# Patient Record
Sex: Male | Born: 1995 | Race: White | Hispanic: No | Marital: Single | Smoking: Never smoker
Health system: Southern US, Community
[De-identification: ages and names within clinical notes are randomized; demographics above are authoritative.]

## PROBLEM LIST (undated history)

## (undated) DIAGNOSIS — L700 Acne vulgaris: Secondary | ICD-10-CM

## (undated) DIAGNOSIS — J301 Allergic rhinitis due to pollen: Secondary | ICD-10-CM

## (undated) HISTORY — DX: Acne vulgaris: L70.0

## (undated) HISTORY — DX: Allergic rhinitis due to pollen: J30.1

## (undated) HISTORY — PX: NASAL SEPTUM SURGERY: SHX37

---
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
## 2014-02-24 ENCOUNTER — Ambulatory Visit: Payer: BC Managed Care – PPO | Admitting: Family Medicine

## 2014-02-24 ENCOUNTER — Encounter: Payer: Self-pay | Admitting: Internal Medicine

## 2014-02-24 ENCOUNTER — Ambulatory Visit (INDEPENDENT_AMBULATORY_CARE_PROVIDER_SITE_OTHER): Payer: BC Managed Care – PPO | Admitting: Internal Medicine

## 2014-02-24 VITALS — BP 114/58 | HR 86 | Temp 98.6°F | Wt 153.0 lb

## 2014-02-24 DIAGNOSIS — J209 Acute bronchitis, unspecified: Secondary | ICD-10-CM

## 2014-02-24 DIAGNOSIS — J208 Acute bronchitis due to other specified organisms: Secondary | ICD-10-CM

## 2014-02-24 MED ORDER — AZITHROMYCIN 250 MG PO TABS: ORAL_TABLET | ORAL | Status: DC

## 2014-02-24 NOTE — Progress Notes (Signed)
Pre visit review using our clinic review tool, if applicable. No additional management support is needed unless otherwise documented below in the visit note. 

## 2014-02-24 NOTE — Patient Instructions (Addendum)
Cough, Adult  A cough is a reflex that helps clear your throat and airways. It can help heal the body or may be a reaction to an irritated airway. A cough may only last 2 or 3 weeks (acute) or may last more than 8 weeks (chronic).  CAUSES Acute cough:  Viral or bacterial infections. Chronic cough:  Infections.  Allergies.  Asthma.  Post-nasal drip.  Smoking.  Heartburn or acid reflux.  Some medicines.  Chronic lung problems (COPD).  Cancer. SYMPTOMS   Cough.  Fever.  Chest pain.  Increased breathing rate.  High-pitched whistling sound when breathing (wheezing).  Colored mucus that you cough up (sputum). TREATMENT   A bacterial cough may be treated with antibiotic medicine.  A viral cough must run its course and will not respond to antibiotics.  Your caregiver may recommend other treatments if you have a chronic cough. HOME CARE INSTRUCTIONS   Only take over-the-counter or prescription medicines for pain, discomfort, or fever as directed by your caregiver. Use cough suppressants only as directed by your caregiver.  Use a cold steam vaporizer or humidifier in your bedroom or home to help loosen secretions.  Sleep in a semi-upright position if your cough is worse at night.  Rest as needed.  Stop smoking if you smoke. SEEK IMMEDIATE MEDICAL CARE IF:   You have pus in your sputum.  Your cough starts to worsen.  You cannot control your cough with suppressants and are losing sleep.  You begin coughing up blood.  You have difficulty breathing.  You develop pain which is getting worse or is uncontrolled with medicine.  You have a fever. MAKE SURE YOU:   Understand these instructions.  Will watch your condition.  Will get help right away if you are not doing well or get worse. Document Released: 01/18/2011 Document Revised: 10/14/2011 Document Reviewed: 01/18/2011 ExitCare Patient Information 2015 [employer]. This information is not intended  to replace advice given to you by your health care provider. Make sure you discuss any questions you have with your health care provider.  

## 2014-02-24 NOTE — Progress Notes (Signed)
HPI  Pt presents to the clinic today with c/o cough. He reports this started 1 month ago. The cough is productive of green sputum. It does seem to be worse at night. He does have some associated shortness of breath. He denies fever, chills or body aches. He does have a history of seasonal allergies. He takes Flonase. He has not had sick contacts that he is aware of.   Review of Systems      Past Medical History  Diagnosis Date  . Allergic rhinitis due to pollen   . Acne vulgaris     Family History  Problem Relation Age of Onset  . Ovarian cancer Mother     History   Social History  . Marital Status: Single    Spouse Name: N/A    Number of Children: 0  . Years of Education: N/A   Occupational History  . student    Social History Main Topics  . Smoking status: Current Some Day Smoker    Types: Cigarettes  . Smokeless tobacco: Never Used  . Alcohol Use: No  . Drug Use: Yes    Special: Marijuana  . Sexual Activity: Yes    Partners: Female   Other Topics Concern  . Not on file   Social History Narrative   Senior at [employer]   Lives with his mother   Visits his father   Wants to go to college   No sports    No Known Allergies   Constitutional:  Denies headache, fatigue, fever or abrupt weight changes.  HEENT: . Denies eye redness, eye pain, pressure behind the eyes, facial pain, nasal congestion, ear pain, ringing in the ears, wax buildup, runny nose or bloody nose. Respiratory: Positive cough. Denies difficulty breathing or shortness of breath.  Cardiovascular: Denies chest pain, chest tightness, palpitations or swelling in the hands or feet.   No other specific complaints in a complete review of systems (except as listed in HPI above).  Objective:   BP 114/58  Pulse 86  Temp(Src) 98.6 F (37 C) (Oral)  Wt 153 lb (69.4 kg)  SpO2 99% Wt Readings from Last 3 Encounters:  02/24/14 153 lb (69.4 kg) (54%*, Z = 0.10)  02/11/13 152 lb 8 oz  (69.174 kg) (61%*, Z = 0.27)   * Growth percentiles are based on CDC 2-20 Years data.     General: Appears his stated age, well developed, well nourished in NAD. HEENT: Head: normal shape and size; Eyes: sclera white, no icterus, conjunctiva pink, PERRLA and EOMs intact; Ears: Tm's gray and intact, normal light reflex; Nose: mucosa pink and moist, septum midline; Throat/Mouth: + PND. Teeth present, mucosa erythematous and moist, no exudate noted, no lesions or ulcerations noted.  Neck: Mild cervical lymphadenopathy. Neck supple, trachea midline. No massses, lumps or thyromegaly present.  Cardiovascular: Normal rate and rhythm. S1,S2 noted.  No murmur, rubs or gallops noted. No JVD or BLE edema. No carotid bruits noted. Pulmonary/Chest: Normal effort and scattered rhonchi in the bases. No respiratory distress. No wheezes, rales noted.      Assessment & Plan:   Acute bronchitis  Get some rest and drink plenty of water eRx for Azithromax x 5 days Delsym OTC for cough  RTC as needed or if symptoms persist.

## 2014-04-25 ENCOUNTER — Ambulatory Visit: Payer: BC Managed Care – PPO | Admitting: Family Medicine

## 2014-05-03 ENCOUNTER — Telehealth: Payer: Self-pay | Admitting: Family Medicine

## 2014-05-03 NOTE — Telephone Encounter (Signed)
Patient has appointment with you tomorrow for a follow up from [facility] in [place] [facility].  Patient's mother requested records and is asking if you have received the records.  If you haven't received the records, patient's mother wants to know if you want him to reschedule the appointment for tomorrow.  .Marland Kitchen

## 2014-05-03 NOTE — Telephone Encounter (Signed)
I have them - they are on my desk.

## 2014-05-04 ENCOUNTER — Encounter: Payer: Self-pay | Admitting: Family Medicine

## 2014-05-04 ENCOUNTER — Ambulatory Visit (INDEPENDENT_AMBULATORY_CARE_PROVIDER_SITE_OTHER): Payer: BC Managed Care – PPO | Admitting: Family Medicine

## 2014-05-04 VITALS — BP 100/70 | HR 61 | Temp 98.3°F | Ht 71.5 in | Wt 155.0 lb

## 2014-05-04 DIAGNOSIS — M5412 Radiculopathy, cervical region: Secondary | ICD-10-CM

## 2014-05-04 DIAGNOSIS — T1490XA Injury, unspecified, initial encounter: Secondary | ICD-10-CM

## 2014-05-04 MED ORDER — LEVOFLOXACIN 500 MG PO TABS
500.0000 mg | ORAL_TABLET | Freq: Every day | ORAL | Status: DC
Start: 2014-05-04 — End: 2015-04-06
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
## 2014-05-04 NOTE — Progress Notes (Signed)
Pre visit review using our clinic review tool, if applicable. No additional management support is needed unless otherwise documented below in the visit note. 

## 2014-05-04 NOTE — Patient Instructions (Signed)

## 2014-05-05 ENCOUNTER — Telehealth: Payer: Self-pay | Admitting: Family Medicine

## 2014-05-05 NOTE — Telephone Encounter (Signed)
emmi mailed  °

## 2014-05-10 ENCOUNTER — Telehealth: Payer: Self-pay | Admitting: Family Medicine

## 2014-05-10 NOTE — Telephone Encounter (Signed)
Pt's mother called wanting to let Dr Patsy Lageropland and the referral coordinator know that the pt is scheduled to have a second opinion with an Orthopedics Specialist and right now, does not want to schedule the MRI. Pt's mother stated she will call back with an update and to let you know if MRI is still needed. Pt's mother stated that the pt has had a previous problem with this side of his body and is going to see a specialist that he has seen before. Shirlee LimerickMarion informed that pt does not wish to scheule MRI at this current time. Pt's mother also wanted to get son's precious medical records, sent here, from dr[place] in [place]. Pt's mother informed that we can not release records from another office. Pt's mother aware she will have to contact [facility]. Thank you

## 2014-05-11 NOTE — Telephone Encounter (Signed)
That is very reasonable

## 2014-08-02 ENCOUNTER — Encounter: Payer: BC Managed Care – PPO | Admitting: Family Medicine

## 2015-04-06 ENCOUNTER — Encounter: Payer: Self-pay | Admitting: Family Medicine

## 2015-04-06 ENCOUNTER — Ambulatory Visit (INDEPENDENT_AMBULATORY_CARE_PROVIDER_SITE_OTHER): Payer: BLUE CROSS/BLUE SHIELD | Admitting: Family Medicine

## 2015-04-06 VITALS — BP 100/60 | HR 97 | Temp 97.8°F | Ht 71.5 in | Wt 156.0 lb

## 2015-04-06 DIAGNOSIS — K529 Noninfective gastroenteritis and colitis, unspecified: Secondary | ICD-10-CM

## 2015-04-06 DIAGNOSIS — R1084 Generalized abdominal pain: Secondary | ICD-10-CM

## 2015-04-06 LAB — HEPATIC FUNCTION PANEL
ALT: 17 U/L (ref 0–53)
AST: 19 U/L (ref 0–37)
Albumin: 5 g/dL (ref 3.5–5.2)
Alkaline Phosphatase: 89 U/L (ref 52–171)
BILIRUBIN DIRECT: 0.2 mg/dL (ref 0.0–0.3)
TOTAL PROTEIN: 8 g/dL (ref 6.0–8.3)
Total Bilirubin: 0.7 mg/dL (ref 0.2–1.2)

## 2015-04-06 LAB — CBC WITH DIFFERENTIAL/PLATELET
Basophils Absolute: 0 10*3/uL (ref 0.0–0.1)
Basophils Relative: 0.8 % (ref 0.0–3.0)
Eosinophils Absolute: 0.1 10*3/uL (ref 0.0–0.7)
Eosinophils Relative: 2.1 % (ref 0.0–5.0)
HEMATOCRIT: 47.2 % (ref 36.0–49.0)
Hemoglobin: 15.6 g/dL (ref 12.0–16.0)
LYMPHS ABS: 2.1 10*3/uL (ref 0.7–4.0)
Lymphocytes Relative: 34.2 % (ref 24.0–48.0)
MCHC: 33.1 g/dL (ref 31.0–37.0)
MCV: 96.4 fl (ref 78.0–98.0)
MONOS PCT: 5.9 % (ref 3.0–12.0)
Monocytes Absolute: 0.4 10*3/uL (ref 0.1–1.0)
NEUTROS ABS: 3.5 10*3/uL (ref 1.4–7.7)
NEUTROS PCT: 57 % (ref 43.0–71.0)
PLATELETS: 342 10*3/uL (ref 150.0–575.0)
RBC: 4.89 Mil/uL (ref 3.80–5.70)
RDW: 13.4 % (ref 11.4–15.5)
WBC: 6.2 10*3/uL (ref 4.5–13.5)

## 2015-04-06 LAB — BASIC METABOLIC PANEL
BUN: 13 mg/dL (ref 6–23)
CHLORIDE: 103 meq/L (ref 96–112)
CO2: 30 meq/L (ref 19–32)
CREATININE: 0.98 mg/dL (ref 0.40–1.50)
Calcium: 10 mg/dL (ref 8.4–10.5)
GFR: 103.99 mL/min (ref >60.00)
Glucose, Bld: 92 mg/dL (ref 70–99)
Potassium: 3.8 mEq/L (ref 3.5–5.1)
Sodium: 139 mEq/L (ref 135–145)

## 2015-04-06 NOTE — Progress Notes (Signed)
Pre visit review using our clinic review tool, if applicable. No additional management support is needed unless otherwise documented below in the visit note. 

## 2015-04-06 NOTE — Progress Notes (Signed)
Dr. Karleen Hampshire T. Laurice Kimmons, MD, CAQ Sports Medicine Primary Care and Sports Medicine [address] [place], [address] Phone: [phone] Fax: [phone]  04/06/2015  Patient: Nathan Erickson, MRN: [id number], DOB: 1996-05-12, 19 y.o.  Primary Physician:  Hannah Beat, MD  Chief Complaint: Abdominal Pain  Subjective:   Nathan Erickson is a 19 y.o. very pleasant male patient who presents with the following:  Stomach is bothering him a lot Has a lot of gas, diarrhea, urgency to have a bowel movement.  Feels bloated and constant irritation.   Diet has an impact.  3-10 times a day Sometimes will get some mucous, no blood.  Working at [employer] and going to [employer] Diet, soda does not help.  Fried food does not help. Sugar does not help it at all.  Grease.   Wakes up sometimes with pain in the AM. Discomfort a lot of the time depending on food intake  Unk GI family history, UC, crohns, celiac  Wt Readings from Last 3 Encounters:  04/06/15 156 lb (70.761 kg) (52 %*, Z = 0.05)  05/04/14 155 lb (70.308 kg) (56 %*, Z = 0.14)  02/24/14 153 lb (69.4 kg) (54 %*, Z = 0.10)   * Growth percentiles are based on CDC 2-20 Years data.     Past Medical History, Surgical History, Social History, Family History, Problem List, Medications, and Allergies have been reviewed and updated if relevant.  There are no active problems to display for this patient.   Past Medical History  Diagnosis Date  . Allergic rhinitis due to pollen   . Acne vulgaris     Past Surgical History  Procedure Laterality Date  . Orif clavicle fracture  2009    Dr. Mack Guise  . Nasal septum surgery      Social History   Social History  . Marital Status: Single    Spouse Name: N/A  . Number of Children: 0  . Years of Education: N/A   Occupational History  . student    Social History Main Topics  . Smoking status: Former Smoker    Types: Cigarettes  . Smokeless tobacco: Never Used  .  Alcohol Use: No  . Drug Use: Yes    Special: Marijuana  . Sexual Activity:    Partners: Female   Other Topics Concern  . Not on file   Social History Narrative   Senior at [employer]   Lives with his mother   Visits his father   Wants to go to college   No sports    Family History  Problem Relation Age of Onset  . Ovarian cancer Mother     No Known Allergies  Medication list reviewed and updated in full in [facility].  ROS: GEN: Acute illness details above GI: as above GU: maintaining adequate hydration and urination Pulm: No SOB Interactive and getting along well at home.  Otherwise, ROS is as per the HPI.   Objective:   BP 100/60 mmHg  Pulse 97  Temp(Src) 97.8 F (36.6 C) (Oral)  Ht 5' 11.5" (1.816 m)  Wt 156 lb (70.761 kg)  BMI 21.46 kg/m2  GEN: WDWN, NAD, Non-toxic, A & O x 3 HEENT: Atraumatic, Normocephalic. Neck supple. No masses, No LAD. Ears and Nose: No external deformity. CV: RRR, No M/G/R. No JVD. No thrill. No extra heart sounds. PULM: CTA B, no wheezes, crackles, rhonchi. No retractions. No resp. distress. No accessory muscle use. ABD: S, mild diffuse  tenderness, ND, +BS. No rebound. No HSM. EXTR: No c/c/e NEURO Normal gait.  PSYCH: Normally interactive. Conversant. Not depressed or anxious appearing.  Calm demeanor.     Laboratory and Imaging Data:  Assessment and Plan:   Chronic diarrhea - Plan: CBC with Differential/Platelet, Basic metabolic panel, Hepatic function panel, Ambulatory referral to Gastroenterology, Celiac panel 10, CANCELED: CELIAC DISEASE COMPLETE PANEL  Abdominal pain, generalized - Plan: CBC with Differential/Platelet, Basic metabolic panel, Hepatic function panel, Ambulatory referral to Gastroenterology, Celiac panel 10, CANCELED: CELIAC DISEASE COMPLETE PANEL  With 1 year history, pain, 3-10 loose BM daily with mucous, he needs a full GI work-up with UC and crohn's eval. I will start the work-up with  basic labs and then have him f/u with GI. Appreciate help.  Follow-up: GI  New Prescriptions   No medications on file   Orders Placed This Encounter  Procedures  . CBC with Differential/Platelet  . Basic metabolic panel  . Hepatic function panel  . Celiac panel 10  . Ambulatory referral to Gastroenterology    Signed,  Karleen Hampshire T. Woodward Klem, MD   Patient's Medications  New Prescriptions   No medications on file  Previous Medications   FLUTICASONE (FLONASE) 50 MCG/ACT NASAL SPRAY    Place 2 sprays into the nose daily as needed.   Modified Medications   No medications on file  Discontinued Medications   LEVOFLOXACIN (LEVAQUIN) 500 MG TABLET    Take 1 tablet (500 mg total) by mouth daily.

## 2015-04-06 NOTE — Patient Instructions (Addendum)
Ask mom and dad about family history: Ulcerative Colitis Crohn's disease Celiac disease Gluten intolerance   REFERRALS TO SPECIALISTS, SPECIAL TESTS (MRI, CT, ULTRASOUNDS)  MARION or LINDA will help you. ASK CHECK-IN FOR HELP.  Imaging / Special Testing referrals sometimes can be done same day if EMERGENCY, but others can take 2 or 3 days to get an appointment. Starting in 2015, many of the new Medicare plans and Obamacare plans take much longer.   Specialist appointment times vary a great deal, based on their schedule / openings. -- Some specialists have very long wait times. (Example. Dermatology. Multiple months  for non-cancer)

## 2015-04-07 LAB — CELIAC PANEL 10
Endomysial Screen: NEGATIVE
GLIADIN IGG: 4 U (ref <20)
Gliadin IgA: 10 Units (ref <20)
IGA: 245 mg/dL (ref 68–379)
TISSUE TRANSGLUTAMINASE AB, IGA: 1 U/mL (ref <4)
Tissue Transglut Ab: 2 U/mL (ref <6)

## 2015-04-11 ENCOUNTER — Encounter: Payer: Self-pay | Admitting: *Deleted

## 2015-05-15 ENCOUNTER — Ambulatory Visit: Payer: Self-pay | Admitting: Gastroenterology

## 2015-05-22 ENCOUNTER — Ambulatory Visit: Payer: Self-pay | Admitting: Gastroenterology

## 2015-05-22 ENCOUNTER — Encounter: Payer: Self-pay | Admitting: Family Medicine

## 2015-05-25 ENCOUNTER — Encounter: Payer: Self-pay | Admitting: Gastroenterology

## 2015-05-25 ENCOUNTER — Ambulatory Visit (INDEPENDENT_AMBULATORY_CARE_PROVIDER_SITE_OTHER): Payer: BLUE CROSS/BLUE SHIELD | Admitting: Gastroenterology

## 2015-05-25 ENCOUNTER — Ambulatory Visit: Payer: Self-pay | Admitting: Gastroenterology

## 2015-05-25 ENCOUNTER — Other Ambulatory Visit: Payer: BLUE CROSS/BLUE SHIELD

## 2015-05-25 VITALS — BP 107/52 | HR 67 | Temp 98.4°F | Ht 72.0 in | Wt 154.0 lb

## 2015-05-25 DIAGNOSIS — R197 Diarrhea, unspecified: Secondary | ICD-10-CM | POA: Diagnosis not present

## 2015-05-25 DIAGNOSIS — R109 Unspecified abdominal pain: Secondary | ICD-10-CM

## 2015-05-25 DIAGNOSIS — R634 Abnormal weight loss: Secondary | ICD-10-CM | POA: Diagnosis not present

## 2015-05-25 NOTE — Progress Notes (Signed)
Gastroenterology Consultation  Referring Provider:     Hannah Beatopland, Spencer, MD Primary Care Physician:  Hannah BeatSpencer Copland, MD Primary Gastroenterologist:  Dr. Servando SnareWohl     Reason for Consultation:     Diarrhea and abdominal pain        HPI:   Nathan Erickson is a 19 y.o. y/o male referred for consultation & management of diarrhea and abdominal pain by Dr. Hannah BeatSpencer Copland, MD.  This patient comes in today with a history of a year and a half of abdominal pain with diarrhea. The patient reports that he can have any where from 3-5 bowel movements a day. The patient also reports that he has diarrhea waking him up in the middle of night at least once a week. There is no report of any black stools or bloody stools. The patient also reports that he has lost weight over the last year and a half. There is no report of any family history of colon cancer colon polyps. The patient also denies any family history of Crohn's disease or ulcerative colitis. There is no report of any nausea vomiting associated with his abdominal pain and the pain is diffusely distributed throughout the entire abdomen.  Past Medical History  Diagnosis Date  . Allergic rhinitis due to pollen   . Acne vulgaris     Past Surgical History  Procedure Laterality Date  . Orif clavicle fracture  2009    Dr. Mack GuiseArmour  . Nasal septum surgery      Prior to Admission medications   Medication Sig Start Date End Date Taking? Authorizing Provider  fluticasone (FLONASE) 50 MCG/ACT nasal spray Place 2 sprays into the nose daily as needed.    Yes Historical Provider, MD    Family History  Problem Relation Age of Onset  . Ovarian cancer Mother      Social History  Substance Use Topics  . Smoking status: Never Smoker   . Smokeless tobacco: Never Used  . Alcohol Use: No    Allergies as of 05/25/2015  . (No Known Allergies)    Review of Systems:    All systems reviewed and negative except where noted in HPI.   Physical Exam:  BP  107/52 mmHg  Pulse 67  Temp(Src) 98.4 F (36.9 C) (Oral)  Ht 6' (1.829 m)  Wt 154 lb (69.854 kg)  BMI 20.88 kg/m2 No LMP for male patient. Psych:  Alert and cooperative. Normal mood and affect. General:   Alert,  Well-developed, well-nourished, pleasant and cooperative in NAD Head:  Normocephalic and atraumatic. Eyes:  Sclera clear, no icterus.   Conjunctiva pink. Ears:  Normal auditory acuity. Nose:  No deformity, discharge, or lesions. Mouth:  No deformity or lesions,oropharynx pink & moist. Neck:  Supple; no masses or thyromegaly. Lungs:  Respirations even and unlabored.  Clear throughout to auscultation.   No wheezes, crackles, or rhonchi. No acute distress. Heart:  Regular rate and rhythm; no murmurs, clicks, rubs, or gallops. Abdomen:  Normal bowel sounds.  No bruits.  Soft, non-tender and non-distended without masses, hepatosplenomegaly or hernias noted.  No guarding or rebound tenderness.  Negative Carnett sign.   Rectal:  Deferred.  Msk:  Symmetrical without gross deformities.  Good, equal movement & strength bilaterally. Pulses:  Normal pulses noted. Extremities:  No clubbing or edema.  No cyanosis. Neurologic:  Alert and oriented x3;  grossly normal neurologically. Skin:  Intact without significant lesions or rashes.  No jaundice. Lymph Nodes:  No significant cervical adenopathy. Psych:  Alert and cooperative. Normal mood and affect.  Imaging Studies: No results found.  Assessment and Plan:   [name] is a 19 y.o. y/o male who comes in today with his mother who has a report of a urinary half of diarrhea with abdominal pain and has reportedly some weight loss for the last year. The patient has not had any nausea vomiting but states that his diarrhea has woken him up in the middle night as has the abdominal pain. There is no report of any family history of inflammatory bowel disease. The patient will be set up for a colonoscopy to rule out inflammatory bowel  disease. If the biopsies and negative the patient may need a small bowel follow-through to look for possible small bowel Crohn's. The patient and his mother have been explained the plan and agree with it.   Note: This dictation was prepared with Dragon dictation along with smaller phrase technology. Any transcriptional errors that result from this process are unintentional.

## 2015-06-01 ENCOUNTER — Encounter: Payer: Self-pay | Admitting: Family Medicine

## 2015-06-15 ENCOUNTER — Encounter: Payer: Self-pay | Admitting: Family Medicine

## 2015-07-20 ENCOUNTER — Encounter: Admission: RE | Payer: Self-pay | Source: Ambulatory Visit

## 2015-07-20 ENCOUNTER — Ambulatory Visit
Admission: RE | Admit: 2015-07-20 | Payer: BLUE CROSS/BLUE SHIELD | Source: Ambulatory Visit | Admitting: Gastroenterology

## 2015-07-20 SURGERY — COLONOSCOPY WITH PROPOFOL
Anesthesia: Choice

## 2015-08-01 ENCOUNTER — Encounter: Payer: Self-pay | Admitting: Family Medicine

## 2015-08-01 ENCOUNTER — Ambulatory Visit (INDEPENDENT_AMBULATORY_CARE_PROVIDER_SITE_OTHER): Payer: Commercial Managed Care - PPO | Admitting: Family Medicine

## 2015-08-01 VITALS — Temp 98.5°F | Ht 71.5 in | Wt 153.8 lb

## 2015-08-01 DIAGNOSIS — Z0000 Encounter for general adult medical examination without abnormal findings: Secondary | ICD-10-CM | POA: Diagnosis not present

## 2015-08-01 DIAGNOSIS — Z23 Encounter for immunization: Secondary | ICD-10-CM

## 2015-08-01 NOTE — Progress Notes (Signed)
Pre visit review using our clinic review tool, if applicable. No additional management support is needed unless otherwise documented below in the visit note. 

## 2015-08-01 NOTE — Progress Notes (Signed)
Dr. Frederico Hamman T. Keyonda Bickle, MD, [facility] [place], [address] Phone: [phone] Fax: [phone]  08/01/2015  Patient: Nathan Erickson, MRN: [id number], DOB: August 24, 1995, 19 y.o.  Primary Physician:  Owens Loffler, MD   Chief Complaint  Patient presents with  . Annual Exam   Subjective:   Nathan Erickson is a 19 y.o. pleasant patient who presents with the following:  Preventative Health Maintenance Visit:  Health Maintenance Summary Reviewed and updated, unless pt declines services.  Tobacco History Reviewed. Alcohol: No concerns, no excessive use Exercise Habits: Some activity, rec at least 30 mins 5 times a week STD concerns: no risk or activity to increase risk Drug Use: None Encouraged self-testicular check  Diarrhea has seemed to get better. Cancelled his colonoscopy.   Transfer program at [facility] Maintenance  Topic Date Due  . HIV Screening  08/04/2010  . TETANUS/TDAP  08/04/2014  . INFLUENZA VACCINE  11/03/2015 (Originally 03/06/2015)   Immunization History  Administered Date(s) Administered  . HPV 9-valent 08/01/2015  . Meningococcal B, OMV 08/01/2015  . Meningococcal Conjugate 08/01/2015  . Tdap 11/04/2005   There are no active problems to display for this patient.  Past Medical History  Diagnosis Date  . Allergic rhinitis due to pollen   . Acne vulgaris    Past Surgical History  Procedure Laterality Date  . Orif clavicle fracture  2009    Dr. Francia Greaves  . Nasal septum surgery     Social History   Social History  . Marital Status: Single    Spouse Name: N/A  . Number of Children: 0  . Years of Education: N/A   Occupational History  . student    Social History Main Topics  . Smoking status: Never Smoker   . Smokeless tobacco: Never Used  . Alcohol Use: No  . Drug Use: No  . Sexual Activity:    Partners: Female   Other Topics Concern  . Not on file   Social  History Narrative   Senior at [employer] with his mother   Visits his father   Wants to go to college   No sports   Family History  Problem Relation Age of Onset  . Ovarian cancer Mother    No Known Allergies  Medication list has been reviewed and updated.   General: Denies fever, chills, sweats. No significant weight loss. Eyes: Denies blurring,significant itching ENT: Denies earache, sore throat, and hoarseness. Cardiovascular: Denies chest pains, palpitations, dyspnea on exertion Respiratory: Denies cough, dyspnea at rest,wheeezing Breast: no concerns about lumps GI: Denies nausea, vomiting, diarrhea, constipation, change in bowel habits, abdominal pain, melena, hematochezia GU: Denies penile discharge, ED, urinary flow / outflow problems. No STD concerns. Musculoskeletal: Denies back pain, joint pain Derm: Denies rash, itching Neuro: Denies  paresthesias, frequent falls, frequent headaches Psych: Denies depression, anxiety Endocrine: Denies cold intolerance, heat intolerance, polydipsia Heme: Denies enlarged lymph nodes Allergy: No hayfever  Objective:   Temp(Src) 98.5 F (36.9 C) (Oral)  Ht 5' 11.5" (1.816 m)  Wt 153 lb 12 oz (69.741 kg)  BMI 21.15 kg/m2 Ideal Body Weight: Weight in (lb) to have BMI = 25: 181.4  No exam data present  GEN: well developed, well nourished, no acute distress Eyes: conjunctiva and lids normal, PERRLA, EOMI ENT: TM clear, nares clear, oral exam WNL Neck: supple, no lymphadenopathy, no thyromegaly, no JVD Pulm: clear to auscultation  and percussion, respiratory effort normal CV: regular rate and rhythm, S1-S2, no murmur, rub or gallop, no bruits, peripheral pulses normal and symmetric, no cyanosis, clubbing, edema or varicosities GI: soft, non-tender; no hepatosplenomegaly, masses; active bowel sounds all quadrants GU: no hernia, testicular mass, penile discharge Lymph: no cervical, axillary or inguinal  adenopathy MSK: gait normal, muscle tone and strength WNL, no joint swelling, effusions, discoloration, crepitus  SKIN: clear, good turgor, color WNL, no rashes, lesions, or ulcerations Neuro: normal mental status, normal strength, sensation, and motion Psych: alert; oriented to person, place and time, normally interactive and not anxious or depressed in appearance.  All labs reviewed with patient.  Lipids: No results found for: CHOL, TRIG, HDL, LDLDIRECT, VLDL, CHOLHDL CBC: CBC Latest Ref Rng 04/06/2015  WBC 4.5 - 13.5 K/uL 6.2  Hemoglobin 12.0 - 16.0 g/dL 15.6  Hematocrit 36.0 - 49.0 % 47.2  Platelets 150.0 - 575.0 K/uL 383.8    Basic Metabolic Panel:    Component Value Date/Time   NA 139 04/06/2015 1039   K 3.8 04/06/2015 1039   CL 103 04/06/2015 1039   CO2 30 04/06/2015 1039   BUN 13 04/06/2015 1039   CREATININE 0.98 04/06/2015 1039   GLUCOSE 92 04/06/2015 1039   CALCIUM 10.0 04/06/2015 1039   Hepatic Function Latest Ref Rng 04/06/2015  Total Protein 6.0 - 8.3 g/dL 8.0  Albumin 3.5 - 5.2 g/dL 5.0  AST 0 - 37 U/L 19  ALT 0 - 53 U/L 17  Alk Phosphatase 52 - 171 U/L 89  Total Bilirubin 0.2 - 1.2 mg/dL 0.7  Bilirubin, Direct 0.0 - 0.3 mg/dL 0.2    No results found for: TSH No results found for: PSA  Assessment and Plan:   Healthcare maintenance  Need for HPV vaccine - Plan: HPV 9-valent vaccine,Recombinat (Gardasil 9)  Need for meningococcus vaccine - Plan: Meningococcal conjugate vaccine 4-valent IM, Meningococcal B, OMV (Bexsero)  Health Maintenance Exam: The patient's preventative maintenance and recommended screening tests for an annual wellness exam were reviewed in full today. Brought up to date unless services declined.  Counselled on the importance of diet, exercise, and its role in overall health and mortality. The patient's FH and SH was reviewed, including their home life, tobacco status, and drug and alcohol status.  Follow-up: No Follow-up on  file. Unless noted, follow-up in 1 year for Health Maintenance Exam.  New Prescriptions   No medications on file   Modified Medications   No medications on file   Orders Placed This Encounter  Procedures  . Meningococcal conjugate vaccine 4-valent IM  . HPV 9-valent vaccine,Recombinat (Gardasil 9)  . Meningococcal B, OMV (Bexsero)    Signed,  Kayron Kalmar T. Avamarie Crossley, MD   Patient's Medications  New Prescriptions   No medications on file  Previous Medications   FLUTICASONE (FLONASE) 50 MCG/ACT NASAL SPRAY    Place 2 sprays into the nose daily as needed.   Modified Medications   No medications on file  Discontinued Medications   No medications on file

## 2015-09-04 ENCOUNTER — Ambulatory Visit (INDEPENDENT_AMBULATORY_CARE_PROVIDER_SITE_OTHER): Payer: Commercial Managed Care - PPO | Admitting: Family Medicine

## 2015-09-04 ENCOUNTER — Encounter: Payer: Self-pay | Admitting: Family Medicine

## 2015-09-04 VITALS — BP 100/60 | HR 91 | Temp 98.4°F | Ht 71.5 in | Wt 162.0 lb

## 2015-09-04 DIAGNOSIS — Z23 Encounter for immunization: Secondary | ICD-10-CM | POA: Diagnosis not present

## 2015-09-04 DIAGNOSIS — F1210 Cannabis abuse, uncomplicated: Secondary | ICD-10-CM | POA: Diagnosis not present

## 2015-09-04 DIAGNOSIS — F410 Panic disorder [episodic paroxysmal anxiety] without agoraphobia: Secondary | ICD-10-CM

## 2015-09-04 NOTE — Progress Notes (Signed)
Pre visit review using our clinic review tool, if applicable. No additional management support is needed unless otherwise documented below in the visit note. 

## 2015-09-04 NOTE — Progress Notes (Signed)
Dr. Karleen Hampshire T. Danelly Hassinger, MD, [facility] [address] [place], [address] Phone: [phone] Fax: [phone]  09/04/2015  Patient: Nathan Erickson, MRN: [id number], DOB: 04/05/1996, 20 y.o.  Primary Physician:  Hannah Beat, MD   Chief Complaint  Patient presents with  . Personal Issues  . Immunizations    2nd Bexsero   Subjective:   Nathan Erickson is a 20 y.o. very pleasant male patient who presents with the following:  See a/p  Trouble focussing and issues with with focussing and issues with any task really easy. Sometimes back in MS and HS - sometimes worse with overwhelmed and anxious.   Some anxiety -  Sometimes a lot each day.   Dad has bipolar depression Mom is bipolar  Dad had addiction issues  Doesn't sleep the best - in the middle of a dream Has had some depression in the past.   Past Medical History, Surgical History, Social History, Family History, Problem List, Medications, and Allergies have been reviewed and updated if relevant.  There are no active problems to display for this patient.   Past Medical History  Diagnosis Date  . Allergic rhinitis due to pollen   . Acne vulgaris     Past Surgical History  Procedure Laterality Date  . Orif clavicle fracture  2009    Dr. Mack Guise  . Nasal septum surgery      Social History   Social History  . Marital Status: Single    Spouse Name: N/A  . Number of Children: 0  . Years of Education: N/A   Occupational History  . student    Social History Main Topics  . Smoking status: Never Smoker   . Smokeless tobacco: Never Used  . Alcohol Use: No  . Drug Use: No  . Sexual Activity:    Partners: Female   Other Topics Concern  . Not on file   Social History Narrative   Senior at [employer]   Lives with his mother   Visits his father   Wants to go to college   No sports    Family History  Problem Relation Age of Onset  .  Ovarian cancer Mother     No Known Allergies  Medication list reviewed and updated in full in [facility].   GEN: No acute illnesses, no fevers, chills. GI: No n/v/d, eating normally Pulm: No SOB Interactive and getting along well at home.  Otherwise, ROS is as per the HPI.  Objective:   BP 100/60 mmHg  Pulse 91  Temp(Src) 98.4 F (36.9 C) (Oral)  Ht 5' 11.5" (1.816 m)  Wt 162 lb (73.483 kg)  BMI 22.28 kg/m2  GEN: WDWN, NAD, Non-toxic, A & O x 3 HEENT: Atraumatic, Normocephalic. Neck supple. No masses, No LAD. Ears and Nose: No external deformity. EXTR: No c/c/e NEURO Normal gait.  PSYCH: Normally interactive. Conversant. Not depressed or anxious appearing.  Calm demeanor.   Laboratory and Imaging Data:  Assessment and Plan:   Panic attacks - Plan: Ambulatory referral to Psychology  Marijuana abuse - Plan: Ambulatory referral to Psychology  Need for vaccination for meningococcus - Plan: Meningococcal B, OMV (Bexsero)  The patient confides in me that he has been smoking an excessive amount of marijuana daily, sometimes only once a day, but many times virtually all day long.  He has been doing this for many months.  He stopped doing this about 3 weeks  ago, and since then his mind has been somewhat foggy, and he basically comes in for advice.  He has had some intermittent panic attacks also 2, and it seems like he has been self-medicating some.  With this extensive abuse pattern, it is not surprising that he still does not have his full mental clarity, and this may take some more time.  I counseled him about his abuse patterns and suggested that he may not be the person who can smoke any marijuana at all and should be cautious about substance in general.  He tells me that he thinks that both of his parents have bipolar illness, so I'm highly reluctant to consider starting any type of antidepressant in this case, and I would like for him to get some counseling to help  with non-drug management of his anxiety and panic symptoms.  Follow-up: 2 mo  Orders Placed This Encounter  Procedures  . Meningococcal B, OMV (Bexsero)  . Ambulatory referral to Psychology    Signed,  Karleen Hampshire T. Daenerys Buttram, MD   Patient's Medications  New Prescriptions   No medications on file  Previous Medications   FLUTICASONE (FLONASE) 50 MCG/ACT NASAL SPRAY    Place 2 sprays into the nose daily as needed.    TRIAMCINOLONE CREAM (KENALOG) 0.1 %    Apply topically.  Modified Medications   No medications on file  Discontinued Medications   No medications on file

## 2015-09-04 NOTE — Patient Instructions (Signed)

## 2015-10-12 ENCOUNTER — Ambulatory Visit: Payer: Self-pay | Admitting: Psychology

## 2015-12-14 ENCOUNTER — Ambulatory Visit: Payer: Commercial Managed Care - PPO | Admitting: Family Medicine

## 2015-12-14 ENCOUNTER — Telehealth: Payer: Self-pay | Admitting: Family Medicine

## 2015-12-14 DIAGNOSIS — Z0289 Encounter for other administrative examinations: Secondary | ICD-10-CM

## 2015-12-14 NOTE — Telephone Encounter (Signed)
Pt called to cancel appointment today he car broke down He stated he only needed nurse visit for vaccines but wasn't sure what he needed Please let me know if its ok to schedule

## 2015-12-14 NOTE — Telephone Encounter (Signed)
Left message asking pt to call office  °

## 2015-12-14 NOTE — Telephone Encounter (Signed)
Okay to schedule nurse visit for 2nd Gardasil 9 and Td.

## 2015-12-18 NOTE — Telephone Encounter (Signed)
Left message asking pt to call office  °

## 2016-11-11 ENCOUNTER — Encounter: Payer: Self-pay | Admitting: Family Medicine

## 2016-11-11 ENCOUNTER — Ambulatory Visit (INDEPENDENT_AMBULATORY_CARE_PROVIDER_SITE_OTHER): Payer: Commercial Managed Care - PPO | Admitting: Family Medicine

## 2016-11-11 VITALS — BP 90/60 | HR 61 | Temp 98.3°F | Ht 71.5 in | Wt 168.2 lb

## 2016-11-11 DIAGNOSIS — Z7721 Contact with and (suspected) exposure to potentially hazardous body fluids: Secondary | ICD-10-CM

## 2016-11-11 DIAGNOSIS — Z23 Encounter for immunization: Secondary | ICD-10-CM | POA: Diagnosis not present

## 2016-11-11 DIAGNOSIS — Z0000 Encounter for general adult medical examination without abnormal findings: Secondary | ICD-10-CM

## 2016-11-11 DIAGNOSIS — N50819 Testicular pain, unspecified: Secondary | ICD-10-CM | POA: Diagnosis not present

## 2016-11-11 DIAGNOSIS — Z131 Encounter for screening for diabetes mellitus: Secondary | ICD-10-CM | POA: Diagnosis not present

## 2016-11-11 DIAGNOSIS — Z13220 Encounter for screening for lipoid disorders: Secondary | ICD-10-CM | POA: Diagnosis not present

## 2016-11-11 LAB — URINALYSIS, ROUTINE W REFLEX MICROSCOPIC
BILIRUBIN URINE: NEGATIVE
HGB URINE DIPSTICK: NEGATIVE
KETONES UR: NEGATIVE
LEUKOCYTES UA: NEGATIVE
Nitrite: NEGATIVE
PH: 6 (ref 5.0–8.0)
RBC / HPF: NONE SEEN (ref 0–?)
Specific Gravity, Urine: 1.02 (ref 1.000–1.030)
TOTAL PROTEIN, URINE-UPE24: NEGATIVE
URINE GLUCOSE: NEGATIVE
UROBILINOGEN UA: 0.2 (ref 0.0–1.0)
WBC, UA: NONE SEEN (ref 0–?)

## 2016-11-11 LAB — LIPID PANEL
Cholesterol: 111 mg/dL (ref 0–200)
HDL: 39.8 mg/dL (ref >39.00)
LDL CALC: 55 mg/dL (ref 0–99)
NONHDL: 70.75
Total CHOL/HDL Ratio: 3
Triglycerides: 80 mg/dL (ref 0.0–149.0)
VLDL: 16 mg/dL (ref 0.0–40.0)

## 2016-11-11 LAB — BASIC METABOLIC PANEL
BUN: 21 mg/dL (ref 6–23)
CHLORIDE: 102 meq/L (ref 96–112)
CO2: 31 meq/L (ref 19–32)
Calcium: 9.4 mg/dL (ref 8.4–10.5)
Creatinine, Ser: 0.95 mg/dL (ref 0.40–1.50)
GFR: 106.09 mL/min (ref >60.00)
GLUCOSE: 92 mg/dL (ref 70–99)
POTASSIUM: 4.1 meq/L (ref 3.5–5.1)
SODIUM: 139 meq/L (ref 135–145)

## 2016-11-11 MED ORDER — VALACYCLOVIR HCL 1 G PO TABS: 1000.0000 mg | ORAL_TABLET | Freq: Two times a day (BID) | ORAL | 0 refills | Status: AC

## 2016-11-11 NOTE — Progress Notes (Signed)
Dr. Frederico Hamman T. Jahziel Sinn, MD, [facility] Medicine [place], [address] Phone: [phone] Fax: [phone]  11/11/2016  Patient: Nathan Erickson, MRN: [id number], DOB: Aug 04, 1996, 21 y.o.  Primary Physician:  Owens Loffler, MD   Chief Complaint  Patient presents with  . Annual Exam   Subjective:   Nathan Erickson is a 21 y.o. pleasant patient who presents with the following:  Preventative Health Maintenance Visit:  Health Maintenance Summary Reviewed and updated, unless pt declines services.  Tobacco History Reviewed. + MJ - few days a week Alcohol: No concerns, no excessive use Exercise Habits: Some activity, rec at least 30 mins 5 times a week STD concerns: 3-4 partners in the last year, +/- condoms Drug Use: None Encouraged self-testicular check  Health Maintenance  Topic Date Due  . HIV Screening  08/04/2010  . INFLUENZA VACCINE  03/05/2017  . TETANUS/TDAP  11/12/2026   Immunization History  Administered Date(s) Administered  . HPV 9-valent 08/01/2015, 11/11/2016  . Meningococcal B, OMV 08/01/2015, 09/04/2015  . Meningococcal Conjugate 08/01/2015  . Td 11/11/2016  . Tdap 11/04/2005   There are no active problems to display for this patient.  Past Medical History:  Diagnosis Date  . Acne vulgaris   . Allergic rhinitis due to pollen    Past Surgical History:  Procedure Laterality Date  . NASAL SEPTUM SURGERY    . ORIF CLAVICLE FRACTURE  2009   Dr. Francia Greaves   Social History   Social History  . Marital status: Single    Spouse name: N/A  . Number of children: 0  . Years of education: N/A   Occupational History  . student    Social History Main Topics  . Smoking status: Never Smoker  . Smokeless tobacco: Never Used  . Alcohol use No  . Drug use: No  . Sexual activity: Yes    Partners: Female   Other Topics Concern  . Not on file   Social History Narrative   Senior at [employer] with his mother   Visits his father   Wants to go to college   No sports   Family History  Problem Relation Age of Onset  . Ovarian cancer Mother    No Known Allergies  Medication list has been reviewed and updated.   General: Denies fever, chills, sweats. No significant weight loss. Eyes: Denies blurring,significant itching ENT: Denies earache, sore throat, and hoarseness. Cardiovascular: Denies chest pains, palpitations, dyspnea on exertion Respiratory: Denies cough, dyspnea at rest,wheeezing Breast: no concerns about lumps GI: Denies nausea, vomiting, diarrhea, constipation, change in bowel habits, abdominal pain, melena, hematochezia GU: as above Musculoskeletal: Denies back pain, joint pain Derm: Denies rash, itching Neuro: Denies  paresthesias, frequent falls, frequent headaches Psych: Denies depression, anxiety Endocrine: Denies cold intolerance, heat intolerance, polydipsia Heme: Denies enlarged lymph nodes Allergy: No hayfever  Objective:   BP 90/60   Pulse 61   Temp 98.3 F (36.8 C) (Oral)   Ht 5' 11.5" (1.816 m)   Wt 168 lb 4 oz (76.3 kg)   BMI 23.14 kg/m   No exam data present  GEN: well developed, well nourished, no acute distress Eyes: conjunctiva and lids normal, PERRLA, EOMI ENT: TM clear, nares clear, oral exam WNL Neck: supple, no lymphadenopathy, no thyromegaly, no JVD Pulm: clear to auscultation and percussion, respiratory effort normal CV: regular rate and rhythm, S1-S2, no murmur, rub or gallop, no  bruits, peripheral pulses normal and symmetric, no cyanosis, clubbing, edema or varicosities Chest: no scars, masses GI: soft, non-tender; no hepatosplenomegaly, masses; active bowel sounds all quadrants GU: no hernia, testicular mass, penile discharge. TTP posterior B testicles.  Lymph: no cervical, axillary or inguinal adenopathy MSK: gait normal, muscle tone and strength WNL, no joint swelling, effusions, discoloration, crepitus    SKIN: clear, good turgor, color WNL, no rashes, lesions, or ulcerations Neuro: normal mental status, normal strength, sensation, and motion Psych: alert; oriented to person, place and time, normally interactive and not anxious or depressed in appearance.  All labs reviewed with patient.  Lipids: No results found for: CHOL, TRIG, HDL, LDLDIRECT, VLDL, CHOLHDL CBC: CBC Latest Ref Rng & Units 04/06/2015  WBC 4.5 - 13.5 K/uL 6.2  Hemoglobin 12.0 - 16.0 g/dL 15.6  Hematocrit 36.0 - 49.0 % 47.2  Platelets 150.0 - 575.0 K/uL 016.5    Basic Metabolic Panel:    Component Value Date/Time   NA 139 04/06/2015 1039   K 3.8 04/06/2015 1039   CL 103 04/06/2015 1039   CO2 30 04/06/2015 1039   BUN 13 04/06/2015 1039   CREATININE 0.98 04/06/2015 1039   GLUCOSE 92 04/06/2015 1039   CALCIUM 10.0 04/06/2015 1039   Hepatic Function Latest Ref Rng & Units 04/06/2015  Total Protein 6.0 - 8.3 g/dL 8.0  Albumin 3.5 - 5.2 g/dL 5.0  AST 0 - 37 U/L 19  ALT 0 - 53 U/L 17  Alk Phosphatase 52 - 171 U/L 89  Total Bilirubin 0.2 - 1.2 mg/dL 0.7  Bilirubin, Direct 0.0 - 0.3 mg/dL 0.2    No results found for: TSH No results found for: PSA  Assessment and Plan:   Healthcare maintenance  Testicular pain - Plan: HIV antibody, RPR, Trichomonas vaginalis, RNA, Urinalysis, Routine w reflex microscopic, Urine culture, GC/Chlamydia Probe Amp, CANCELED: GC/Chlamydia Probe Amp  History of exposure to hazardous bodily fluids - Plan: HIV antibody, RPR, Trichomonas vaginalis, RNA, GC/Chlamydia Probe Amp, CANCELED: GC/Chlamydia Probe Amp  Screening for diabetes mellitus - Plan: Basic metabolic panel  Screening, lipid - Plan: Lipid panel  Need for prophylactic vaccination with tetanus-diphtheria (TD) - Plan: Td vaccine greater than or equal to 7yo preservative free IM  Need for HPV vaccine - Plan: HPV 9-valent vaccine,Recombinat  STD check. If neg, treat for epididymitis.  Check urine, as well.  Health  Maintenance Exam: The patient's preventative maintenance and recommended screening tests for an annual wellness exam were reviewed in full today. Brought up to date unless services declined.  Counselled on the importance of diet, exercise, and its role in overall health and mortality. The patient's FH and SH was reviewed, including their home life, tobacco status, and drug and alcohol status.  Follow-up in 1 year for physical exam or additional follow-up below.  Follow-up: No Follow-up on file. Or follow-up in 1 year if not noted.  Meds ordered this encounter  Medications  . valACYclovir (VALTREX) 1000 MG tablet    Sig: Take 1 tablet (1,000 mg total) by mouth 2 (two) times daily.    Dispense:  14 tablet    Refill:  0   Medications Discontinued During This Encounter  Medication Reason  . triamcinolone cream (KENALOG) 0.1 % Completed Course   Orders Placed This Encounter  Procedures  . Trichomonas vaginalis, RNA  . Urine culture  . GC/Chlamydia Probe Amp  . Td vaccine greater than or equal to 7yo preservative free IM  . HPV 9-valent vaccine,Recombinat  .  HIV antibody  . RPR  . Urinalysis, Routine w reflex microscopic  . Basic metabolic panel  . Lipid panel    Signed,  Frederico Hamman T. Darden Flemister, MD   Allergies as of 11/11/2016   No Known Allergies     Medication List       Accurate as of 11/11/16 10:14 AM. Always use your most recent med list.          FLONASE 50 MCG/ACT nasal spray Generic drug:  fluticasone Place 2 sprays into the nose daily as needed.   valACYclovir 1000 MG tablet Commonly known as:  VALTREX Take 1 tablet (1,000 mg total) by mouth 2 (two) times daily.

## 2016-11-12 LAB — RPR

## 2016-11-12 LAB — GC/CHLAMYDIA PROBE AMP
CT Probe RNA: NOT DETECTED
GC PROBE AMP APTIMA: NOT DETECTED

## 2016-11-12 LAB — URINE CULTURE

## 2016-11-12 LAB — HIV ANTIBODY (ROUTINE TESTING W REFLEX): HIV 1&2 Ab, 4th Generation: NONREACTIVE

## 2016-11-14 ENCOUNTER — Telehealth: Payer: Self-pay | Admitting: Family Medicine

## 2016-11-14 ENCOUNTER — Telehealth: Payer: Self-pay | Admitting: *Deleted

## 2016-11-14 LAB — TRICHOMONAS VAGINALIS, PROBE AMP: Trich vag by NAA: NEGATIVE

## 2016-11-14 MED ORDER — LEVOFLOXACIN 500 MG PO TABS: 500.0000 mg | ORAL_TABLET | Freq: Every day | ORAL | 0 refills | Status: AC

## 2016-11-14 NOTE — Telephone Encounter (Signed)
-----   Message from Hannah Beat, MD sent at 11/14/2016  1:22 PM EDT ----- Call  All tests came back totally normal. Chol and all regular labs look great.  Like we discussed, we need to treat him for epididymitis. Levaquin 500 mg, 1 po daily x 10 days. Finish the bottle until empty

## 2016-11-14 NOTE — Telephone Encounter (Signed)
Mr. Tipps notified as instructed by telephone.  Levaquin prescription sent into [facility] as instructed by Dr. Patsy Lager.

## 2016-11-14 NOTE — Telephone Encounter (Signed)
Opened in error

## 2017-10-13 ENCOUNTER — Ambulatory Visit: Payer: Commercial Managed Care - PPO | Admitting: Family Medicine

## 2017-10-13 DIAGNOSIS — Z0289 Encounter for other administrative examinations: Secondary | ICD-10-CM

## 2017-10-16 ENCOUNTER — Ambulatory Visit: Payer: Commercial Managed Care - PPO | Admitting: Family Medicine

## 2017-10-16 ENCOUNTER — Telehealth: Payer: Self-pay | Admitting: *Deleted

## 2017-10-16 DIAGNOSIS — Z0289 Encounter for other administrative examinations: Secondary | ICD-10-CM

## 2017-10-16 NOTE — Telephone Encounter (Signed)
There is no message.

## 2017-10-16 NOTE — Telephone Encounter (Signed)
Copied from CRM [id number]. Topic: Quick Communication - Appointment Cancellation >> Oct 16, 2017  8:34 AM Oneal GroutSebastian, Jennifer S wrote: Patient called to cancel appointment scheduled for 10/16/17. Patient has rescheduled their appointment. Unable to make it.    Route to department's PEC pool.

## 2017-10-20 ENCOUNTER — Ambulatory Visit: Payer: Commercial Managed Care - PPO | Admitting: Family Medicine

## 2017-10-20 DIAGNOSIS — Z0289 Encounter for other administrative examinations: Secondary | ICD-10-CM

## 2019-10-31 ENCOUNTER — Ambulatory Visit: Payer: Commercial Managed Care - PPO | Attending: Internal Medicine

## 2019-10-31 DIAGNOSIS — Z23 Encounter for immunization: Secondary | ICD-10-CM

## 2019-10-31 NOTE — Progress Notes (Signed)
   Covid-[facility] Vaccination [facility]  Name:  Nathan Erickson    MRN: [id number] DOB: [birth year]  10/31/2019  Mr. Nathan Erickson was observed post Covid-19 immunization for 15 minutes without incident. He was provided with Vaccine Information Sheet and instruction to access the V-Safe system.   Mr. Nathan Erickson was instructed to call 911 with any severe reactions post vaccine: Marland Kitchen Difficulty breathing  . Swelling of face and throat  . A fast heartbeat  . A bad rash all over body  . Dizziness and weakness   Immunizations Administered    Name Date Dose VIS Date Route   Pfizer COVID-19 Vaccine 10/31/2019  5:18 PM 0.3 mL 07/16/2019 Intramuscular   Manufacturer: [employer], [employer]   Lot: [id number]   NDC: [address]-5

## 2019-11-28 ENCOUNTER — Ambulatory Visit: Payer: Commercial Managed Care - PPO | Attending: Internal Medicine

## 2019-11-28 DIAGNOSIS — Z23 Encounter for immunization: Secondary | ICD-10-CM

## 2019-11-28 NOTE — Progress Notes (Signed)
   Covid-19 Vaccination Clinic  Name:  Dax Murguia    MRN: [id number] DOB: 1995/08/31  11/28/2019  Mr. Keesling was observed post Covid-19 immunization for 15 minutes without incident. He was provided with Vaccine Information Sheet and instruction to access the V-Safe system.   Mr. Ganaway was instructed to call 911 with any severe reactions post vaccine: Marland Kitchen Difficulty breathing  . Swelling of face and throat  . A fast heartbeat  . A bad rash all over body  . Dizziness and weakness   Immunizations Administered    Name Date Dose VIS Date Route   Pfizer COVID-19 Vaccine 11/28/2019  3:21 PM 0.3 mL 09/29/2018 Intramuscular   Manufacturer: [employer], [employer]   Lot: [id number]   NDC: [address]-9
